# Patient Record
Sex: Female | Born: 2000 | Race: White | Hispanic: No | Marital: Single | Smoking: Never smoker
Health system: Southern US, Community
[De-identification: ages and names within clinical notes are randomized; demographics above are authoritative.]

## PROBLEM LIST (undated history)

## (undated) DIAGNOSIS — Z309 Encounter for contraceptive management, unspecified: Secondary | ICD-10-CM

## (undated) DIAGNOSIS — J40 Bronchitis, not specified as acute or chronic: Secondary | ICD-10-CM

## (undated) DIAGNOSIS — Z7689 Persons encountering health services in other specified circumstances: Principal | ICD-10-CM

## (undated) DIAGNOSIS — J302 Other seasonal allergic rhinitis: Secondary | ICD-10-CM

## (undated) DIAGNOSIS — Q523 Imperforate hymen: Secondary | ICD-10-CM

## (undated) DIAGNOSIS — J45909 Unspecified asthma, uncomplicated: Secondary | ICD-10-CM

## (undated) HISTORY — DX: Other seasonal allergic rhinitis: J30.2

## (undated) HISTORY — PX: MYRINGOTOMY WITH TUBE PLACEMENT: SHX5663

## (undated) HISTORY — DX: Bronchitis, not specified as acute or chronic: J40

## (undated) HISTORY — DX: Encounter for contraceptive management, unspecified: Z30.9

## (undated) HISTORY — DX: Persons encountering health services in other specified circumstances: Z76.89

## (undated) HISTORY — DX: Imperforate hymen: Q52.3

---
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
## 2018-02-20 ENCOUNTER — Encounter: Payer: Self-pay | Admitting: Emergency Medicine

## 2018-02-20 DIAGNOSIS — F329 Major depressive disorder, single episode, unspecified: Secondary | ICD-10-CM | POA: Insufficient documentation

## 2018-02-20 DIAGNOSIS — F411 Generalized anxiety disorder: Secondary | ICD-10-CM | POA: Insufficient documentation

## 2018-03-08 ENCOUNTER — Ambulatory Visit: Payer: Self-pay | Admitting: Physician Assistant

## 2019-01-04 ENCOUNTER — Other Ambulatory Visit: Payer: Self-pay | Admitting: Physician Assistant

## 2019-01-04 NOTE — Telephone Encounter (Signed)
Not seen in epic, pull paper chart please and see if current patient

## 2019-01-04 NOTE — Telephone Encounter (Signed)
I pulled her chart and it looks like her last appt. Was on 12/03/17. The orders do say 1 1/2 tablets daily at 100 mg. Would you like me to call her and remind her it's time for an appt. With [place]?

## 2019-01-05 NOTE — Telephone Encounter (Signed)
Yes, Jennifer Wise, please call her and have her set up appt w/ [place].  I'll go ahead and ok this Rx, b/c she's away in college, but let her know she must make appt for when she's home on break.  Thanks.

## 2019-01-05 NOTE — Telephone Encounter (Signed)
Patient's last appt was 11/03/2017, instructed to follow up in Nov/Dec when on break.   Wait to set up appt?

## 2019-01-05 NOTE — Telephone Encounter (Signed)
Called and spoke with her mother. She is going to make sure Bird schedules an appointment soon.

## 2019-01-05 NOTE — Telephone Encounter (Signed)
ok 

## 2019-02-14 ENCOUNTER — Other Ambulatory Visit: Payer: Self-pay

## 2019-02-14 ENCOUNTER — Encounter: Payer: Self-pay | Admitting: Physician Assistant

## 2019-02-14 ENCOUNTER — Ambulatory Visit (INDEPENDENT_AMBULATORY_CARE_PROVIDER_SITE_OTHER): Payer: BC Managed Care – PPO | Admitting: Physician Assistant

## 2019-02-14 DIAGNOSIS — G4700 Insomnia, unspecified: Secondary | ICD-10-CM

## 2019-02-14 DIAGNOSIS — F411 Generalized anxiety disorder: Secondary | ICD-10-CM

## 2019-02-14 MED ORDER — TRAZODONE HCL 50 MG PO TABS: 50.0000 mg | ORAL_TABLET | Freq: Every evening | ORAL | 0 refills | Status: DC | PRN

## 2019-02-14 MED ORDER — SERTRALINE HCL 100 MG PO TABS: 200.0000 mg | ORAL_TABLET | Freq: Every day | ORAL | 0 refills | Status: DC

## 2019-02-14 NOTE — Progress Notes (Signed)
[facility]  Patient ID: Jennifer Wise,  MRN: [id number]  PCP: Assunta Found, MD  Date of Evaluation: 02/14/2019 Time spent:15 minutes  Chief Complaint:  Chief Complaint    Follow-up     Virtual Visit via Telephone Note  I connected with patient by a video enabled telemedicine application or telephone, with their informed consent, and verified patient privacy and that I am speaking with the correct person using two identifiers.  I am private, in my office and the patient is home in [place]. (college)  I discussed the limitations, risks, security and privacy concerns of performing an evaluation and management service by telephone and the availability of in person appointments. I also discussed with the patient that there may be a patient responsible charge related to this service. The patient expressed understanding and agreed to proceed.   I discussed the assessment and treatment plan with the patient. The patient was provided an opportunity to ask questions and all were answered. The patient agreed with the plan and demonstrated an understanding of the instructions.   The patient was advised to call back or seek an in-person evaluation if the symptoms worsen or if the condition fails to improve as anticipated.  I provided 15 minutes of non-face-to-face time during this encounter.  HISTORY/CURRENT STATUS: HPI for routine med check.  Patient states that for the past 3 months or so she has just felt overwhelmed.  She feels anxious most days.  Sometimes things can be so overwhelming that she just wants to go to bed.  Denies depression symptoms.  She is able to enjoy things.  Energy and motivation are good.  She is not isolating.  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  She is also involved in a sorority, which takes up quite a bit of time as well.  "It is just a lot.  It might have something to do with COVID to but I just feel like  there is a lot on me."  Denies suicidal or homicidal thoughts.  Sleeps well with the trazodone.  Patient denies increased energy with decreased need for sleep, no increased talkativeness, no racing thoughts, no impulsivity or risky behaviors, no increased spending, no increased libido, no grandiosity.  Denies dizziness, syncope, seizures, numbness, tingling, tremor, tics, unsteady gait, slurred speech, confusion. Denies muscle or joint pain, stiffness, or dystonia.  Individual Medical History/ Review of Systems: Changes? :No    Past medications for mental health diagnoses include: unknown  Allergies: Patient has no known allergies.  Current Medications:  Current Outpatient Medications:  .  etonogestrel-ethinyl estradiol (NUVARING) 0.12-0.015 MG/24HR vaginal ring, Insert vaginally and leave in place for 3 consecutive weeks, then remove for 1 week., Disp: 1 each, Rfl: 12 .  Fexofenadine HCl (ALLEGRA PO), Take by mouth daily. , Disp: , Rfl:  .  traZODone (DESYREL) 50 MG tablet, Take 1-2 tablets (50-100 mg total) by mouth at bedtime as needed., Disp: 180 tablet, Rfl: 0 .  BLISOVI FE 1.5/30 1.5-30 MG-MCG tablet, TAKE 1 TABLET BY MOUTH DAILY (Patient not taking: Reported on 10/13/2017), Disp: 84 tablet, Rfl: 3 .  naproxen sodium (ALEVE) 220 MG tablet, Take 1 tablet (220 mg total) by mouth 2 (two) times daily with a meal. (Patient not taking: Reported on 10/13/2017), Disp: , Rfl:  .  sertraline (ZOLOFT) 100 MG tablet, Take 2 tablets (200 mg total) by mouth daily., Disp: 180 tablet, Rfl: 0 Medication Side Effects: none  Family Medical/ Social  History: Changes? Yes see above  MENTAL HEALTH EXAM:  There were no vitals taken for this visit.There is no height or weight on file to calculate BMI.  General Appearance: unable to assess  Eye Contact:  unable to assess  Speech:  Clear and Coherent  Volume:  Normal  Mood:  Euthymic  Affect:  unable to assess  Thought Process:  Goal Directed and  Descriptions of Associations: Intact  Orientation:  Full (Time, Place, and Person)  Thought Content: Logical   Suicidal Thoughts:  No  Homicidal Thoughts:  No  Memory:  WNL  Judgement:  Good  Insight:  Good  Psychomotor Activity:  unable to assess  Concentration:  Concentration: Good  Recall:  Good  Fund of Knowledge: Good  Language: Good  Assets:  Desire for Improvement  ADL's:  Intact  Cognition: WNL  Prognosis:  Good    DIAGNOSES:    ICD-10-CM   1. Generalized anxiety disorder  F41.1   2. Insomnia, unspecified type  G47.00     Receiving Psychotherapy: No    RECOMMENDATIONS:  Increase Zoloft to 200 mg p.o. daily. Continue trazodone 50 mg, 1-2 nightly as needed. Discussed coping techniques. Return in 6 weeks.  Donnal Moat, PA-C

## 2019-03-28 ENCOUNTER — Ambulatory Visit: Payer: BC Managed Care – PPO | Admitting: Physician Assistant

## 2019-04-12 ENCOUNTER — Other Ambulatory Visit: Payer: Self-pay | Admitting: Physician Assistant

## 2019-04-28 ENCOUNTER — Ambulatory Visit (INDEPENDENT_AMBULATORY_CARE_PROVIDER_SITE_OTHER): Payer: BC Managed Care – PPO | Admitting: Physician Assistant

## 2019-04-28 ENCOUNTER — Encounter: Payer: Self-pay | Admitting: Physician Assistant

## 2019-04-28 DIAGNOSIS — F411 Generalized anxiety disorder: Secondary | ICD-10-CM

## 2019-04-28 DIAGNOSIS — G4700 Insomnia, unspecified: Secondary | ICD-10-CM

## 2019-04-28 DIAGNOSIS — F329 Major depressive disorder, single episode, unspecified: Secondary | ICD-10-CM

## 2019-04-28 MED ORDER — BUSPIRONE HCL 15 MG PO TABS: ORAL_TABLET | ORAL | 1 refills | Status: DC

## 2019-04-28 MED ORDER — TRAZODONE HCL 50 MG PO TABS: 50.0000 mg | ORAL_TABLET | Freq: Every evening | ORAL | 0 refills | Status: DC | PRN

## 2019-04-28 MED ORDER — SERTRALINE HCL 100 MG PO TABS: 200.0000 mg | ORAL_TABLET | Freq: Every day | ORAL | 0 refills | Status: DC

## 2019-04-28 NOTE — Progress Notes (Signed)
[facility]  Patient ID: Jennifer Wise,  MRN: [id number]  PCP: Assunta Found, MD  Date of Evaluation: 04/28/2019 Time spent:20 minutes  Chief Complaint:  Chief Complaint    Depression; Insomnia     Virtual Visit via Telephone Note  I connected with patient by a video enabled telemedicine application or telephone, with their informed consent, and verified patient privacy and that I am speaking with the correct person using two identifiers.  I am private, in my home and the patient is home (college in [place].)  I discussed the limitations, risks, security and privacy concerns of performing an evaluation and management service by telephone and the availability of in person appointments. I also discussed with the patient that there may be a patient responsible charge related to this service. The patient expressed understanding and agreed to proceed.   I discussed the assessment and treatment plan with the patient. The patient was provided an opportunity to ask questions and all were answered. The patient agreed with the plan and demonstrated an understanding of the instructions.   The patient was advised to call back or seek an in-person evaluation if the symptoms worsen or if the condition fails to improve as anticipated.  I provided 20 minutes of non-face-to-face time during this encounter.  HISTORY/CURRENT STATUS: HPI For routine med check.   We increased the Zoloft d/t anxiety. Not any better.  Still gets random 'burst of anxiety.  Not a full-blown PA but it'll last about 30 mins to an hour.'  She tries to distract herself with music or getting online.  That helps for only a short while and then she starts getting anxious again.  It is more of a generalized sense of anxiety like something bad is about to happen.  She denies having palpitations or sweats, no shortness of breath.  Is tired all the time, and doesn't have a lot of energy or motivation.  "I want to sleep all  the time. I sleep maybe 10-12 hours and still feel tired." When she gets a chance, she does enjoy things like going to the beach w/ friends, but she has to make herself go.  States she feels nervous about going out due to the anxiety and she feels safer in her apartment.  Doesn't cry easily.  Denies SI/HI.  Every few weeks, she'll feel more energetic and motivated.  But she does not have decreased need for sleep.  She still sleeps 10 to 12 hours a night.  No associated impulsivity or risky behaviors, no increased spending or libido.  No grandiosity.  No hallucinations.  No irritability.  She still needs the trazodone to fall asleep.  She takes 1 or 2 depending on how she is feeling.  She does not feel this is what is causing the increased fatigue or somnolence.  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  Denies dizziness, syncope, seizures, numbness, tingling, tremor, tics, unsteady gait, slurred speech, confusion. Denies muscle or joint pain, stiffness, or dystonia.  Individual Medical History/ Review of Systems: Changes? :No    Past medications for mental health diagnoses include: Zoloft, Trazodone  Allergies: Patient has no known allergies.  Current Medications:  Current Outpatient Medications:  .  etonogestrel-ethinyl estradiol (NUVARING) 0.12-0.015 MG/24HR vaginal ring, Insert vaginally and leave in place for 3 consecutive weeks, then remove for 1 week., Disp: 1 each, Rfl: 12 .  Fexofenadine HCl (ALLEGRA PO), Take by mouth daily. , Disp: , Rfl:  .  sertraline (ZOLOFT) 100 MG tablet, Take 2 tablets (200 mg total) by mouth daily., Disp: 180 tablet, Rfl: 0 .  traZODone (DESYREL) 50 MG tablet, Take 1-2 tablets (50-100 mg total) by mouth at bedtime as needed., Disp: 180 tablet, Rfl: 0 .  BLISOVI FE 1.5/30 1.5-30 MG-MCG tablet, TAKE 1 TABLET BY MOUTH DAILY (Patient not taking: Reported on 10/13/2017), Disp: 84 tablet, Rfl: 3 .  busPIRone (BUSPAR) 15 MG tablet, 1/3  po bid for 1 week, then 2/3 po bid for 1 week, then 1 pill bid., Disp: 60 tablet, Rfl: 1 .  naproxen sodium (ALEVE) 220 MG tablet, Take 1 tablet (220 mg total) by mouth 2 (two) times daily with a meal. (Patient not taking: Reported on 10/13/2017), Disp: , Rfl:  Medication Side Effects: none  Family Medical/ Social History: Changes? No  MENTAL HEALTH EXAM:  There were no vitals taken for this visit.There is no height or weight on file to calculate BMI.  General Appearance: unable to assess  Eye Contact:  unable to assess  Speech:  Clear and Coherent  Volume:  Normal  Mood:  Euthymic  Affect:  unable to assess  Thought Process:  Goal Directed and Descriptions of Associations: Intact  Orientation:  Full (Time, Place, and Person)  Thought Content: Logical   Suicidal Thoughts:  No  Homicidal Thoughts:  No  Memory:  WNL  Judgement:  Good  Insight:  Good  Psychomotor Activity:  unable to assess  Concentration:  Concentration: Good  Recall:  Good  Fund of Knowledge: Good  Language: Good  Assets:  Desire for Improvement  ADL's:  Intact  Cognition: WNL  Prognosis:  Good    DIAGNOSES:    ICD-10-CM   1. Generalized anxiety disorder  F41.1   2. Insomnia, unspecified type  G47.00   3. 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  We discussed her symptoms and I believe at least in part that she is avoiding going out and doing things because of the anxiety more so than depression.  She agrees. Options would be to change the Zoloft to a different SSRI or even try an SNRI.  Or add BuSpar.  After discussing the benefits, risks, side effects, she prefers to add the BuSpar.   I did not discuss this with her today but if she is not feeling any better at the next visit, I think we should draw CBC, CMP and TSH. Start BuSpar 15 mg, one third p.o. twice daily for 1 week, then two thirds p.o. twice daily for 1 week, then 1 p.o. twice  daily. Continue Zoloft 100 mg, 2 p.o. daily. Continue trazodone 50 mg, 1-2 nightly as needed. Recommend counseling.  She can see someone at [facility] or get an appointment for one of our therapist in the office.  They can also do telehealth. Return in 6 weeks.  Donnal Moat, PA-C

## 2019-05-19 ENCOUNTER — Ambulatory Visit: Payer: BC Managed Care – PPO | Admitting: Physician Assistant

## 2019-07-13 ENCOUNTER — Other Ambulatory Visit: Payer: Self-pay | Admitting: Physician Assistant

## 2019-11-26 ENCOUNTER — Other Ambulatory Visit: Payer: Self-pay | Admitting: Physician Assistant

## 2020-03-23 ENCOUNTER — Other Ambulatory Visit: Payer: Self-pay | Admitting: Physician Assistant

## 2020-06-17 ENCOUNTER — Telehealth (INDEPENDENT_AMBULATORY_CARE_PROVIDER_SITE_OTHER): Payer: BC Managed Care – PPO | Admitting: Physician Assistant

## 2020-06-17 ENCOUNTER — Encounter: Payer: Self-pay | Admitting: Physician Assistant

## 2020-06-17 DIAGNOSIS — F411 Generalized anxiety disorder: Secondary | ICD-10-CM

## 2020-06-17 DIAGNOSIS — G4700 Insomnia, unspecified: Secondary | ICD-10-CM

## 2020-06-17 MED ORDER — BUSPIRONE HCL 15 MG PO TABS: ORAL_TABLET | ORAL | 1 refills | Status: DC

## 2020-06-17 MED ORDER — TRAZODONE HCL 50 MG PO TABS: 50.0000 mg | ORAL_TABLET | Freq: Every evening | ORAL | 1 refills | Status: DC | PRN

## 2020-06-17 NOTE — Progress Notes (Signed)
[facility]  Patient ID: Jennifer Wise,  MRN: [id number]  PCP: Assunta Found, MD  Date of Evaluation: 06/17/2020 Time spent:30 minutes  Chief Complaint:  Chief Complaint    Anxiety; Depression; Insomnia     Virtual Visit via Telehealth  I connected with patient by a video enabled telemedicine application with their informed consent, and verified patient privacy and that I am speaking with the correct person using two identifiers.  I am private, in my office and the patient is at home.  I discussed the limitations, risks, security and privacy concerns of performing an evaluation and management service by video and the availability of in person appointments. I also discussed with the patient that there may be a patient responsible charge related to this service. The patient expressed understanding and agreed to proceed.   I discussed the assessment and treatment plan with the patient. The patient was provided an opportunity to ask questions and all were answered. The patient agreed with the plan and demonstrated an understanding of the instructions.   The patient was advised to call back or seek an in-person evaluation if the symptoms worsen or if the condition fails to improve as anticipated.  I provided 30 minutes of non-face-to-face time during this encounter.   HISTORY/CURRENT STATUS: HPI to discuss anxiety.  Lost to follow-up for over a year.  Now reports a lot of anxiety, ruminating thoughts mostly.  Will have a thought stuck in her head and cannot get out of that loop for  hours on the end.  Has a hard time falling asleep because of repetitive thoughts.  No compulsions.  She was given BuSpar at the last visit over a year ago but not sure if it helped or not.  She did not take it very long.  No palpitations, shortness of breath, chest pain, or increased sweating.  Graduated from [employer] in Dec.  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  Says her mom wanted patient to ask me about ADHD.  Her mom has it.  Patient was never diagnosed at a younger age but states the last semester of college she had a hard time staying on task and getting things done in a timely manner.  Things took her much longer to get done and then they used to.  Patient denies loss of interest in usual activities and is able to enjoy things.  Motivation is decreased slightly, just feels too anxious to want to do things..  Appetite has not changed.  No extreme sadness, tearfulness, or feelings of hopelessness.  Denies suicidal or homicidal thoughts.  Patient denies increased energy with decreased need for sleep, no increased talkativeness, no racing thoughts, no impulsivity or risky behaviors, no increased spending, no increased libido, no grandiosity, no increased irritability or anger, and no hallucinations.  Denies dizziness, syncope, seizures, numbness, tingling, tremor, tics, unsteady gait, slurred speech, confusion. Denies muscle or joint pain, stiffness, or dystonia.  Individual Medical History/ Review of Systems: Changes? :No    Past medications for mental health diagnoses include: Zoloft, Trazodone, Buspar  Allergies: Patient has no known allergies.  Current Medications:  Current Outpatient Medications:  .  busPIRone (BUSPAR) 15 MG tablet, 1/3 po bid for 1 week, then 2/3 po bid for 1 week, then 1 po bid, Disp: 60 tablet, Rfl: 1 .  BLISOVI FE 1.5/30 1.5-30  MG-MCG tablet, TAKE 1 TABLET BY MOUTH DAILY (Patient not taking: Reported on 10/13/2017), Disp: 84 tablet, Rfl: 3 .  etonogestrel-ethinyl estradiol (NUVARING) 0.12-0.015 MG/24HR vaginal ring, Insert vaginally and leave in place for 3 consecutive weeks, then remove for 1 week. (Patient not taking: Reported on 06/17/2020), Disp: 1 each, Rfl: 12 .  Fexofenadine HCl (ALLEGRA PO), Take by mouth daily.  (Patient not  taking: Reported on 06/17/2020), Disp: , Rfl:  .  naproxen sodium (ALEVE) 220 MG tablet, Take 1 tablet (220 mg total) by mouth 2 (two) times daily with a meal. (Patient not taking: No sig reported), Disp: , Rfl:  .  sertraline (ZOLOFT) 100 MG tablet, Take 2 tablets (200 mg total) by mouth daily. (Patient not taking: Reported on 06/17/2020), Disp: 180 tablet, Rfl: 0 .  traZODone (DESYREL) 50 MG tablet, Take 1-2 tablets (50-100 mg total) by mouth at bedtime as needed., Disp: 180 tablet, Rfl: 1 Medication Side Effects: none  Family Medical/ Social History: Changes? No  MENTAL HEALTH EXAM:  There were no vitals taken for this visit.There is no height or weight on file to calculate BMI.  General Appearance: Casual, Neat and Well Groomed  Eye Contact:  Good  Speech:  Clear and Coherent  Volume:  Normal  Mood:  Euthymic  Affect:  Appropriate  Thought Process:  Goal Directed and Descriptions of Associations: Intact  Orientation:  Full (Time, Place, and Person)  Thought Content: Logical   Suicidal Thoughts:  No  Homicidal Thoughts:  No  Memory:  WNL  Judgement:  Good  Insight:  Good  Psychomotor Activity:  Normal  Concentration:  Concentration: Good and Attention Span: Fair  Recall:  Good  Fund of Knowledge: Good  Language: Good  Assets:  Desire for Improvement  ADL's:  Intact  Cognition: WNL  Prognosis:  Good    DIAGNOSES:    ICD-10-CM   1. Generalized anxiety disorder  F41.1   2. Insomnia, unspecified type  G47.00     Receiving Psychotherapy: No    RECOMMENDATIONS:  PDMP was reviewed. I provided 30 minutes of nonface-to-face time during this encounter, including time spent before and after the appointment and chart review. Briefly discussed ADHD symptoms.  I think the anxiety she is experiencing is the most likely cause for her decreased concentration.  We will see how those symptoms are after treating the anxiety. Discussed treatment for anxiety, including restarting the Zoloft  and/or BuSpar.  We agree that BuSpar would be the better choice since she is not having symptoms of depression except low energy and motivation, it seems that she does not want to do things because she is too anxious to do so.  Sleep hygiene was discussed.  No caffeine after lunch.  Blue blocker glasses. Start BuSpar 15 mg 1/3 tablet twice daily for 1 week, then increase to 2/3 tablet twice daily for 1 week, then increase to 1 tablet twice daily for anxiety. Continue trazodone 50 mg, 1-2 p.o. nightly as needed sleep. Consider counseling. Return in 2 months.  Melony Overly, PA-C

## 2021-04-21 ENCOUNTER — Other Ambulatory Visit: Payer: Self-pay

## 2021-04-21 ENCOUNTER — Encounter: Payer: Self-pay | Admitting: Physician Assistant

## 2021-04-21 ENCOUNTER — Ambulatory Visit (INDEPENDENT_AMBULATORY_CARE_PROVIDER_SITE_OTHER): Payer: BC Managed Care – PPO | Admitting: Physician Assistant

## 2021-04-21 DIAGNOSIS — F32A Depression, unspecified: Secondary | ICD-10-CM | POA: Diagnosis not present

## 2021-04-21 DIAGNOSIS — G4700 Insomnia, unspecified: Secondary | ICD-10-CM | POA: Diagnosis not present

## 2021-04-21 DIAGNOSIS — F411 Generalized anxiety disorder: Secondary | ICD-10-CM | POA: Diagnosis not present

## 2021-04-21 MED ORDER — ESCITALOPRAM OXALATE 5 MG PO TABS: 5.0000 mg | ORAL_TABLET | Freq: Every day | ORAL | 1 refills | Status: DC

## 2021-04-21 MED ORDER — TRAZODONE HCL 50 MG PO TABS: 50.0000 mg | ORAL_TABLET | Freq: Every evening | ORAL | 1 refills | Status: DC | PRN

## 2021-04-21 NOTE — Progress Notes (Signed)
[facility]  Patient ID: Jennifer Wise,  MRN: [id number]  PCP: Assunta Found, MD  Date of Evaluation: 04/21/2021 Time spent:30 minutes  Chief Complaint:  Chief Complaint   Anxiety; Follow-up      HISTORY/CURRENT STATUS: Lost to follow-up for 6 to 8 months.  More anxious, PA last around 7-8 minutes but then she cries afterwards. Panic attacks happen a few times a month. Has racing thoughts daily. Can't get things out of her head. Ruminates and obsesses about things. Compulsively checks her alarm or she can't relax.  Trouble falling asleep and staying asleep just because she cannot get her mind to shut off.  Patient denies loss of interest in usual activities and is able to enjoy things. Energy and motivation are good.   No extreme sadness or increased crying.  She does feel kind of hopeless at times.  Appetite is normal.  No calorie restricting, binging or purging, or laxative use.  No cutting.  Denies suicidal or homicidal thoughts.  Patient denies increased energy with decreased need for sleep, no increased talkativeness, no racing thoughts, no impulsivity or risky behaviors, no increased spending, no increased libido, no grandiosity, no increased irritability or anger, and no hallucinations.  Review of Systems  Constitutional: Negative.   HENT: Negative.    Eyes: Negative.   Respiratory: Negative.    Cardiovascular: Negative.   Gastrointestinal: Negative.   Genitourinary: Negative.   Musculoskeletal: Negative.   Skin: Negative.   Neurological: Negative.   Endo/Heme/Allergies: Negative.   Psychiatric/Behavioral:         See HPI.   Individual Medical History/ Review of Systems: Changes? :No    Past medications for mental health diagnoses include: Zoloft, Trazodone, Buspar probably wasn't effective (she only took for 2 months though)  Allergies: Patient has no known allergies.  Current Medications:  Current Outpatient Medications:    escitalopram (LEXAPRO) 5  MG tablet, Take 1 tablet (5 mg total) by mouth daily., Disp: 30 tablet, Rfl: 1   BLISOVI FE 1.5/30 1.5-30 MG-MCG tablet, TAKE 1 TABLET BY MOUTH DAILY (Patient not taking: No sig reported), Disp: 84 tablet, Rfl: 3   busPIRone (BUSPAR) 15 MG tablet, 1/3 po bid for 1 week, then 2/3 po bid for 1 week, then 1 po bid (Patient not taking: Reported on 04/21/2021), Disp: 60 tablet, Rfl: 1   etonogestrel-ethinyl estradiol (NUVARING) 0.12-0.015 MG/24HR vaginal ring, Insert vaginally and leave in place for 3 consecutive weeks, then remove for 1 week. (Patient not taking: Reported on 06/17/2020), Disp: 1 each, Rfl: 12   Fexofenadine HCl (ALLEGRA PO), Take by mouth daily.  (Patient not taking: Reported on 06/17/2020), Disp: , Rfl:    naproxen sodium (ALEVE) 220 MG tablet, Take 1 tablet (220 mg total) by mouth 2 (two) times daily with a meal. (Patient not taking: Reported on 10/13/2017), Disp: , Rfl:    sertraline (ZOLOFT) 100 MG tablet, Take 2 tablets (200 mg total) by mouth daily. (Patient not taking: Reported on 06/17/2020), Disp: 180 tablet, Rfl: 0   traZODone (DESYREL) 50 MG tablet, Take 1-2 tablets (50-100 mg total) by mouth at bedtime as needed., Disp: 180 tablet, Rfl: 1 Medication Side Effects: none  Family Medical/ Social History: Changes? Graduated from [employer] last month. 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  Moved back home with her dad.  Is happy there.  MENTAL HEALTH EXAM:  There were no vitals taken for this visit.There is no height or weight on file to calculate BMI.  General  Appearance: Casual, Neat and Well Groomed  Eye Contact:  Good  Speech:  Clear and Coherent  Volume:  Normal  Mood:  Anxious and Depressed  Affect:  Depressed, Tearful, and Anxious  Thought Process:  Goal Directed and Descriptions of Associations: Intact  Orientation:  Full (Time, Place, and Person)  Thought Content: Logical   Suicidal Thoughts:  No  Homicidal Thoughts:  No  Memory:  WNL  Judgement:  Good   Insight:  Good  Psychomotor Activity:  Normal  Concentration:  Concentration: Good and Attention Span: Fair  Recall:  Good  Fund of Knowledge: Good  Language: Good  Assets:  Desire for Improvement  ADL's:  Intact  Cognition: WNL  Prognosis:  Good    DIAGNOSES:    ICD-10-CM   1. Generalized anxiety disorder  F41.1     2. Mild depression  F32.A     3. Insomnia, unspecified type  G47.00        Receiving Psychotherapy: No    RECOMMENDATIONS:  PDMP was reviewed. No results available.  I provided 30 minutes of face to face time during this encounter, including time spent before and after the visit in records review, medical decision making, counseling pertinent to today's visit, and charting.  We discussed her symptoms and I recommend an SSRI.  She has taken Zoloft in the past but does not remember if it was effective or not or why it was stopped.  It is not clear in the notes as she has been lost to follow-up on several different occasions. We discussed the purpose of the SSRI, not only to help depression but it is a great medication to prevent anxiety as well.  Benefits, risks and side effects were discussed and she would like to try a different 1.  I recommend Lexapro and ironically, her mom takes it and does well. Start Lexapro 5 mg, 1 p.o. daily. Continue trazodone 50 mg, 1-2 p.o. nightly as needed sleep. Recommend counseling. Return in 4 to 6 weeks.  Melony Overly, PA-C

## 2021-05-15 ENCOUNTER — Other Ambulatory Visit (HOSPITAL_COMMUNITY)
Admission: RE | Admit: 2021-05-15 | Discharge: 2021-05-15 | Disposition: A | Discharge status: Home / Self Care | Payer: BC Managed Care – PPO | Source: Ambulatory Visit | Attending: Adult Health | Admitting: Adult Health

## 2021-05-15 ENCOUNTER — Encounter: Payer: Self-pay | Admitting: Adult Health

## 2021-05-15 ENCOUNTER — Other Ambulatory Visit: Payer: Self-pay

## 2021-05-15 ENCOUNTER — Ambulatory Visit (INDEPENDENT_AMBULATORY_CARE_PROVIDER_SITE_OTHER): Payer: BC Managed Care – PPO | Admitting: Adult Health

## 2021-05-15 VITALS — BP 112/72 | HR 79 | Ht 68.0 in | Wt 155.0 lb

## 2021-05-15 DIAGNOSIS — Z01419 Encounter for gynecological examination (general) (routine) without abnormal findings: Secondary | ICD-10-CM | POA: Insufficient documentation

## 2021-05-15 NOTE — Progress Notes (Signed)
Patient ID: Jennifer Wise, female   DOB: Jun 30, 2000, 21 y.o.   MRN: [id number] History of Present Illness: Jennifer Wise is a 21 year old white female,single, G0P0, in for a well woman gyn exam and first pap. She is going to get her Master's in August. PCP is Dr Phillips Odor.   Current Medications, Allergies, Past Medical History, Past Surgical History, Family History and Social History were reviewed in [employer] record.     Review of Systems:  Patient denies any headaches, hearing loss, fatigue, blurred vision, shortness of breath, chest pain, abdominal pain, problems with bowel movements, urination, or intercourse. (Not active). No joint pain or mood swings.  She is off depo and this is first period and it is a little heavier that what she remembered.   Physical Exam:BP 112/72 (BP Location: Left Arm, Patient Position: Sitting, Cuff Size: Normal)    Pulse 79    Ht 5\' 8"  (1.727 m)    Wt 155 lb (70.3 kg)    LMP 05/10/2021    BMI 23.57 kg/m   General:  Well developed, well nourished, no acute distress Skin:  Warm and dry Neck:  Midline trachea, normal thyroid, good ROM, no lymphadenopathy Lungs; Clear to auscultation bilaterally Breast:  No dominant palpable mass, retraction, or nipple discharge Cardiovascular: Regular rate and rhythm Abdomen:  Soft, non tender, no hepatosplenomegaly has light brown birth mark LLQ on abdomen. Pelvic:  External genitalia is normal in appearance, no lesions.  The vagina is normal in appearance,+period blood in vault.05/12/2021 Urethra has no lesions or masses. The cervix is nulliparous, pap with GC/CHL performed. Uterus is felt to be normal size, shape, and contour.  No adnexal masses or tenderness noted.Bladder is non tender, no masses felt. Rectal:Deferred Extremities/musculoskeletal:  No swelling or varicosities noted, no clubbing or cyanosis Psych:  No mood changes, alert and cooperative,seems happy AA is 6 Fall risk is low Depression screen PHQ 2/9  05/15/2021  Decreased Interest 1  Down, Depressed, Hopeless 1  PHQ - 2 Score 2  Altered sleeping 2  Tired, decreased energy 1  Change in appetite 0  Feeling bad or failure about yourself  1  Trouble concentrating 2  Moving slowly or fidgety/restless 0  Suicidal thoughts 0  PHQ-9 Score 8    She is on lexapro and trazodone and sees 07/13/2021.   GAD 7 : Generalized Anxiety Score 05/15/2021  Nervous, Anxious, on Edge 1  Control/stop worrying 1  Worry too much - different things 1  Trouble relaxing 1  Restless 2  Easily annoyed or irritable 2  Afraid - awful might happen 1  Total GAD 7 Score 9      Upstream - 05/15/21 1050       Pregnancy Intention Screening   Does the patient want to become pregnant in the next year? No    Does the patient's partner want to become pregnant in the next year? No    Would the patient like to discuss contraceptive options today? No      Contraception Wrap Up   Current Method Abstinence    End Method Female Condom            Examination chaperoned by 07/13/21 LPN  Impression and Plan: 1. Encounter for gynecological examination with Papanicolaou smear of cervix Pap sent  Physical in 1 year Pap in 3 if normal Call if wants OCs to control periods if not better after few cycles

## 2021-05-20 LAB — CYTOLOGY - PAP
Chlamydia: NEGATIVE
Comment: NEGATIVE
Comment: NORMAL
Diagnosis: NEGATIVE
Neisseria Gonorrhea: NEGATIVE

## 2021-05-25 ENCOUNTER — Other Ambulatory Visit: Payer: Self-pay | Admitting: Physician Assistant

## 2021-05-28 ENCOUNTER — Other Ambulatory Visit: Payer: Self-pay

## 2021-05-28 ENCOUNTER — Encounter: Payer: Self-pay | Admitting: Physician Assistant

## 2021-05-28 ENCOUNTER — Ambulatory Visit (INDEPENDENT_AMBULATORY_CARE_PROVIDER_SITE_OTHER): Payer: BC Managed Care – PPO | Admitting: Physician Assistant

## 2021-05-28 DIAGNOSIS — F32A Depression, unspecified: Secondary | ICD-10-CM

## 2021-05-28 DIAGNOSIS — F411 Generalized anxiety disorder: Secondary | ICD-10-CM

## 2021-05-28 DIAGNOSIS — G4700 Insomnia, unspecified: Secondary | ICD-10-CM | POA: Diagnosis not present

## 2021-05-28 MED ORDER — ESCITALOPRAM OXALATE 10 MG PO TABS: 10.0000 mg | ORAL_TABLET | Freq: Every day | ORAL | 1 refills | Status: DC

## 2021-05-28 NOTE — Progress Notes (Signed)
[facility]  Patient ID: Jennifer Wise,  MRN: [id number]  PCP: Assunta Found, MD  Date of Evaluation: 05/28/2021 Time spent:20 minutes  Chief Complaint:  Chief Complaint   Anxiety; Depression; Insomnia; Follow-up     HISTORY/CURRENT STATUS: Here for 6-week follow-up.  Lexapro was started at the last visit.  States she is about 60% better than before starting it.  She does have anxiety still, more generalized than panic attacks.  No known trigger.  She is having some stomach upset a couple of times a week since starting the Lexapro.  Mostly loose stools.  She does not feel like it is bad enough to change medications though since this is helping.  Is able to enjoy things.  Denies decreased energy or motivation.  Appetite has not changed.  No extreme sadness, tearfulness, or feelings of hopelessness.  Denies any changes in concentration, making decisions or remembering things.  Sleeping well using the trazodone 50 mg nightly.  Denies suicidal or homicidal thoughts.  Denies dizziness, syncope, seizures, numbness, tingling, tremor, tics, unsteady gait, slurred speech, confusion. Denies muscle or joint pain, stiffness, or dystonia.  Individual Medical History/ Review of Systems: Changes? :No    Past medications for mental health diagnoses include: Zoloft, Trazodone, Buspar probably wasn't effective (she only took for 2 months though)  Allergies: Patient has no known allergies.  Current Medications:  Current Outpatient Medications:    escitalopram (LEXAPRO) 10 MG tablet, Take 1 tablet (10 mg total) by mouth daily., Disp: 90 tablet, Rfl: 1   traZODone (DESYREL) 50 MG tablet, Take 1-2 tablets (50-100 mg total) by mouth at bedtime as needed., Disp: 180 tablet, Rfl: 1 Medication Side Effects: none  Family Medical/ Social History: Changes? no  MENTAL HEALTH EXAM:  Last menstrual period 05/10/2021.There is no height or weight on file to calculate BMI.  General Appearance:  Casual, Neat and Well Groomed  Eye Contact:  Good  Speech:  Clear and Coherent  Volume:  Normal  Mood:  Anxious  Affect:  Anxious  Thought Process:  Goal Directed and Descriptions of Associations: Intact  Orientation:  Full (Time, Place, and Person)  Thought Content: Logical   Suicidal Thoughts:  No  Homicidal Thoughts:  No  Memory:  WNL  Judgement:  Good  Insight:  Good  Psychomotor Activity:  Normal  Concentration:  Concentration: Good and Attention Span: Fair  Recall:  Good  Fund of Knowledge: Good  Language: Good  Assets:  Desire for Improvement  ADL's:  Intact  Cognition: WNL  Prognosis:  Good    DIAGNOSES:    ICD-10-CM   1. Generalized anxiety disorder  F41.1     2. Mild depression  F32.A     3. Insomnia, unspecified type  G47.00         Receiving Psychotherapy: No    RECOMMENDATIONS:  PDMP was reviewed. No results available.  I provided 20 minutes of face to face time during this encounter, including time spent before and after the visit in records review, medical decision making, counseling pertinent to today's visit, and charting.  I am glad to hear that the Lexapro is helping some.  I do recommend increasing the dose.  She will let me know if the GI issues worsen or become intolerable and I will change to a different SSRI.  She can call between visits if that is the case. Increase Lexapro to 10 mg, 1 p.o. daily. Continue trazodone 50 mg, 1-2 p.o. nightly as needed sleep. Return in 6  weeks.  Melony Overly, PA-C

## 2021-06-05 ENCOUNTER — Other Ambulatory Visit: Payer: Self-pay

## 2021-06-05 ENCOUNTER — Ambulatory Visit (INDEPENDENT_AMBULATORY_CARE_PROVIDER_SITE_OTHER): Payer: BC Managed Care – PPO

## 2021-06-05 ENCOUNTER — Ambulatory Visit
Admission: EM | Admit: 2021-06-05 | Discharge: 2021-06-05 | Disposition: A | Discharge status: Home / Self Care | Payer: BC Managed Care – PPO | Attending: Urgent Care | Admitting: Urgent Care

## 2021-06-05 ENCOUNTER — Encounter: Payer: Self-pay | Admitting: Emergency Medicine

## 2021-06-05 DIAGNOSIS — M79675 Pain in left toe(s): Secondary | ICD-10-CM | POA: Diagnosis not present

## 2021-06-05 DIAGNOSIS — S90122A Contusion of left lesser toe(s) without damage to nail, initial encounter: Secondary | ICD-10-CM | POA: Diagnosis not present

## 2021-06-05 DIAGNOSIS — S91312A Laceration without foreign body, left foot, initial encounter: Secondary | ICD-10-CM | POA: Diagnosis not present

## 2021-06-05 DIAGNOSIS — W2209XA Striking against other stationary object, initial encounter: Secondary | ICD-10-CM | POA: Diagnosis not present

## 2021-06-05 MED ORDER — NAPROXEN 375 MG PO TABS: 375.0000 mg | ORAL_TABLET | Freq: Two times a day (BID) | ORAL | 0 refills | Status: DC

## 2021-06-05 NOTE — ED Provider Notes (Signed)
[facility]   MRN: [id number] DOB: [birth year]  Subjective:   Jennifer Wise is a 21 y.o. female presenting for suffering a left fourth toe injury.  Patient woke up to go to the bathroom in the middle the night.  When she came back she stubbed her toe against the bed.  This caused some bleeding from the left fourth toe.  She has applied Neosporin to the area.  Tdap is up-to-date.  No current facility-administered medications for this encounter.  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   Menstrual extraction 01/02/2013   Seasonal allergies      Past Surgical History:  Procedure Laterality Date   HYMENECTOMY N/A 03/30/2014   Procedure: HYMENOTOMY;  Surgeon: Tilda Burrow, MD;  Location: [facility];  Service: Gynecology;  Laterality: N/A;   MYRINGOTOMY WITH TUBE PLACEMENT Bilateral     Family History  Problem Relation Age of Onset   Diabetes Paternal Grandfather    Heart attack Paternal Grandfather    Macular degeneration Maternal Grandfather    Cancer Maternal Grandfather        colon   Breast cancer Mother     Social History   Tobacco Use   Smoking status: Never   Smokeless tobacco: Never  Vaping Use   Vaping Use: Never used  Substance Use Topics   Alcohol use: Yes    Alcohol/week: 8.0 standard drinks    Types: 8 Standard drinks or equivalent per week    Comment: sometimes   Drug use: No    ROS   Objective:   Vitals: BP 131/89 (BP Location: Right Arm)    Pulse 80    Temp 98.5 F (36.9 C) (Oral)    Resp 18    LMP 05/31/2021 (Exact Date)    SpO2 98%    Physical Exam Constitutional:      General: She is not in acute distress.    Appearance: Normal appearance. She is well-developed. She is not ill-appearing, toxic-appearing or diaphoretic.  HENT:     Head: Normocephalic and atraumatic.     Nose: Nose normal.     Mouth/Throat:     Mouth: Mucous membranes are moist.  Eyes:     General: No scleral icterus.       Right eye: No discharge.        Left eye: No discharge.     Extraocular Movements: Extraocular movements intact.  Cardiovascular:     Rate and Rhythm: Normal rate.  Pulmonary:     Effort: Pulmonary effort is normal.  Musculoskeletal:     Left foot: Decreased range of motion. Normal capillary refill. Swelling, laceration, tenderness and bony tenderness present. No deformity or crepitus.       Feet:  Skin:    General: Skin is warm and dry.  Neurological:     General: No focal deficit present.     Mental Status: She is alert and oriented to person, place, and time.  Psychiatric:        Mood and Affect: Mood normal.  Behavior: Behavior normal.    DG Foot Complete Left  Result Date: 06/05/2021 CLINICAL DATA:  Pain and swelling fourth toe, struck toe on bed last night EXAM: LEFT FOOT - COMPLETE 3+ VIEW COMPARISON:  None FINDINGS: Osseous mineralization normal. Joint spaces preserved. No acute fracture, dislocation, or bone destruction. IMPRESSION: No acute osseous abnormalities. Electronically Signed   By: Ulyses Southward M.D.   On: 06/05/2021 14:04     Assessment and Plan :   PDMP not reviewed this encounter.  1. Contusion of fourth toe of left foot, initial encounter   2. Toe pain, left   3. Superficial laceration of left foot, initial encounter    Counseled on general management of the left toe contusion including use of dressings to the superficial laceration in the postop shoe.  Naproxen for pain and inflammation. Counseled patient on potential for adverse effects with medications prescribed/recommended today,  ER and return-to-clinic precautions discussed, patient verbalized understanding.    Wallis Bamberg, [place] 06/05/21 1408

## 2021-06-05 NOTE — ED Triage Notes (Signed)
Hit left 4th toe on bed last night.

## 2021-07-10 ENCOUNTER — Encounter: Payer: Self-pay | Admitting: Physician Assistant

## 2021-07-10 ENCOUNTER — Telehealth (INDEPENDENT_AMBULATORY_CARE_PROVIDER_SITE_OTHER): Payer: BC Managed Care – PPO | Admitting: Physician Assistant

## 2021-07-10 DIAGNOSIS — F411 Generalized anxiety disorder: Secondary | ICD-10-CM

## 2021-07-10 DIAGNOSIS — G4700 Insomnia, unspecified: Secondary | ICD-10-CM

## 2021-07-10 DIAGNOSIS — F32A Depression, unspecified: Secondary | ICD-10-CM

## 2021-07-10 NOTE — Progress Notes (Signed)
[facility] ? ?Patient ID: Laycee Poon,  ?MRN: [id number] ? ?PCP: Assunta Found, MD ? ?Date of Evaluation: 07/10/2021 ?Time spent:20 minutes ? ?Chief Complaint:  ?Chief Complaint   ?Depression; Insomnia; Follow-up ?  ? ?Virtual Visit via Telehealth ? ?I connected with patient by a video enabled telemedicine application with their informed consent, and verified patient privacy and that I am speaking with the correct person using two identifiers.  I am private, in my office and the patient is at work. ? ?I discussed the limitations, risks, security and privacy concerns of performing an evaluation and management service by telephone video and the availability of in person appointments. I also discussed with the patient that there may be a patient responsible charge related to this service. The patient expressed understanding and agreed to proceed. ?  ?I discussed the assessment and treatment plan with the patient. The patient was provided an opportunity to ask questions and all were answered. The patient agreed with the plan and demonstrated an understanding of the instructions. ?  ?The patient was advised to call back or seek an in-person evaluation if the symptoms worsen or if the condition fails to improve as anticipated. ? ?I provided 20  minutes of non-face-to-face time during this encounter. ? ?HISTORY/CURRENT STATUS: ?Here for 6-week follow-up. ? ?Lexapro was increased at the last visit.  She states she is probably 85 to 90% better now than she was when we started the Lexapro approximately 3 months ago.  The anxiety is almost nonexistent now. When she drinks alcohol, she is anxious the next day.  She has cut down on alcohol consumption because of this.  She is not having panic attacks at all and when she does feel anxious it is more generalized than anything.  The Lexapro has helped a lot.  She is no longer having any GI side effects from it.  No other side effects reported. ? ?Is able to enjoy  things.  Denies decreased energy or motivation.  Appetite has not changed.  No extreme sadness, tearfulness, or feelings of hopelessness.  Denies any changes in concentration, making decisions or remembering things.  Sleeping well using the trazodone 50 mg nightly.  Work is going well.  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  Denies suicidal or homicidal thoughts. ? ?Denies dizziness, syncope, seizures, numbness, tingling, tremor, tics, unsteady gait, slurred speech, confusion. Denies muscle or joint pain, stiffness, or dystonia. ? ?Individual Medical History/ Review of Systems: Changes? :No   ? ?Past medications for mental health diagnoses include: ?Zoloft, Trazodone, Buspar probably wasn't effective (she only took for 2 months though) ? ?Allergies: Patient has no known allergies. ? ?Current Medications:  ?Current Outpatient Medications:  ?  cetirizine (ZYRTEC) 10 MG tablet, Take 10 mg by mouth daily., Disp: , Rfl:  ?  escitalopram (LEXAPRO) 10 MG tablet, Take 1 tablet (10 mg total) by mouth daily., Disp: 90 tablet, Rfl: 1 ?  traZODone (DESYREL) 50 MG tablet, Take 1-2 tablets (50-100 mg total) by mouth at bedtime as needed., Disp: 180 tablet, Rfl: 1 ?  naproxen (NAPROSYN) 375 MG tablet, Take 1 tablet (375 mg total) by mouth 2 (two) times daily with a meal. (Patient not taking: Reported on 07/10/2021), Disp: 30 tablet, Rfl: 0 ?Medication Side Effects: none ? ?Family Medical/ Social History: Changes? no ? ?MENTAL HEALTH EXAM: ? ?There were no vitals taken for this visit.There is no height or weight on file to calculate BMI.  ?General Appearance: Casual, Neat and Well  Groomed  ?Eye Contact:  Good  ?Speech:  Clear and Coherent  ?Volume:  Normal  ?Mood:  Euthymic  ?Affect:  Congruent  ?Thought Process:  Goal Directed and Descriptions of Associations: Intact  ?Orientation:  Full (Time, Place, and Person)  ?Thought Content: Logical   ?Suicidal Thoughts:  No  ?Homicidal Thoughts:  No  ?Memory:  WNL  ?Judgement:  Good   ?Insight:  Good  ?Psychomotor Activity:  Normal  ?Concentration:  Concentration: Good and Attention Span: Fair  ?Recall:  Good  ?Fund of Knowledge: Good  ?Language: Good  ?Assets:  Desire for Improvement  ?ADL's:  Intact  ?Cognition: WNL  ?Prognosis:  Good  ? ? ?DIAGNOSES:  ?  ICD-10-CM   ?1. Generalized anxiety disorder  F41.1   ?  ?2. Insomnia, unspecified type  G47.00   ?  ?3. Mild depression  F32.A   ?  ? ? ?Receiving Psychotherapy: No  ? ? ?RECOMMENDATIONS:  ?PDMP was reviewed. No results available.  ?I provided 20 minutes of non-face to face time during this encounter, including time spent before and after the visit in records review, medical decision making, counseling pertinent to today's visit, and charting.  ?I am glad to hear that she is doing better!  No changes in medications are necessary. ? ?Continue Lexapro 10 mg, 1 p.o. daily. ?Continue trazodone 50 mg, 1-2 p.o. nightly as needed sleep. ?Return in late July or early August, before her refills are due. ? ?Donnal Moat, PA-C  ?

## 2021-11-13 ENCOUNTER — Telehealth: Payer: Self-pay | Admitting: Physician Assistant

## 2021-11-13 ENCOUNTER — Other Ambulatory Visit: Payer: Self-pay

## 2021-11-13 MED ORDER — ESCITALOPRAM OXALATE 10 MG PO TABS: 10.0000 mg | ORAL_TABLET | Freq: Every day | ORAL | 0 refills | Status: DC

## 2021-11-13 NOTE — Telephone Encounter (Signed)
Pt called and said that she needs a refill on her lexapro 10 mg. She has a new pharmacy to send it too. [facility] located [address] [place] [address]

## 2021-11-13 NOTE — Telephone Encounter (Signed)
Please schedule appt

## 2021-11-14 ENCOUNTER — Telehealth: Payer: Self-pay | Admitting: Physician Assistant

## 2021-11-14 ENCOUNTER — Other Ambulatory Visit: Payer: Self-pay

## 2021-11-14 MED ORDER — ESCITALOPRAM OXALATE 10 MG PO TABS: 10.0000 mg | ORAL_TABLET | Freq: Every day | ORAL | 0 refills | Status: DC

## 2021-11-14 NOTE — Telephone Encounter (Signed)
Rx sent 

## 2021-11-14 NOTE — Telephone Encounter (Signed)
Jennifer Wise called to make appt.  Scheduled for 8/21.  Needs refill of her Lexapro.  Send to [facility] on [address] [place], [place]

## 2021-11-14 NOTE — Telephone Encounter (Signed)
Lvm for pt to call and schedule

## 2021-12-01 ENCOUNTER — Encounter: Payer: Self-pay | Admitting: Physician Assistant

## 2021-12-01 ENCOUNTER — Telehealth (INDEPENDENT_AMBULATORY_CARE_PROVIDER_SITE_OTHER): Payer: BC Managed Care – PPO | Admitting: Physician Assistant

## 2021-12-01 DIAGNOSIS — F411 Generalized anxiety disorder: Secondary | ICD-10-CM

## 2021-12-01 DIAGNOSIS — G4700 Insomnia, unspecified: Secondary | ICD-10-CM | POA: Diagnosis not present

## 2021-12-01 MED ORDER — TRAZODONE HCL 50 MG PO TABS: 50.0000 mg | ORAL_TABLET | Freq: Every evening | ORAL | 1 refills | Status: DC | PRN

## 2021-12-01 MED ORDER — ESCITALOPRAM OXALATE 10 MG PO TABS: 15.0000 mg | ORAL_TABLET | Freq: Every day | ORAL | 1 refills | Status: DC

## 2021-12-01 MED ORDER — HYDROXYZINE HCL 10 MG PO TABS: 5.0000 mg | ORAL_TABLET | Freq: Three times a day (TID) | ORAL | 1 refills | Status: DC | PRN

## 2021-12-01 NOTE — Progress Notes (Signed)
[facility]  Patient ID: Jennifer Wise,  MRN: [id number]  PCP: Jennifer Found, MD  Date of Evaluation: 12/01/2021 Time spent:20 minutes  Chief Complaint:  Chief Complaint   Anxiety; Follow-up    Virtual Visit via Telehealth  I connected with patient by a video enabled telemedicine application with their informed consent, and verified patient privacy and that I am speaking with the correct person using two identifiers.  I am private, in my office and the patient is at school in [place].  I discussed the limitations, risks, security and privacy concerns of performing an evaluation and management service by video and the availability of in person appointments. I also discussed with the patient that there may be a patient responsible charge related to this service. The patient expressed understanding and agreed to proceed.   I discussed the assessment and treatment plan with the patient. The patient was provided an opportunity to ask questions and all were answered. The patient agreed with the plan and demonstrated an understanding of the instructions.   The patient was advised to call back or seek an in-person evaluation if the symptoms worsen or if the condition fails to improve as anticipated.  I provided 20  minutes of non-face-to-face time during this encounter.  HISTORY/CURRENT STATUS: for routine med check.  Has been doing pretty well overall.  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  Her main problem is more anxiety, just generalized, not having panic attacks but sometimes she cannot get things off her mind.  Worries a lot and obsesses about things.  Patient is able to enjoy things.  Energy and motivation are good.  No extreme sadness, tearfulness, or feelings of hopelessness.  Sleeps well most of the time but does take the trazodone most nights.  ADLs and personal hygiene are normal.   Denies any changes in concentration, making decisions, or  remembering things.  Appetite has not changed.  Weight is stable.  Denies laxative use, calorie restricting, or binging and purging.   Denies cutting or any form of self-harm.  Denies suicidal or homicidal thoughts.  Patient denies increased energy with decreased need for sleep, increased talkativeness, racing thoughts, impulsivity or risky behaviors, increased spending, increased libido, grandiosity, increased irritability or anger, paranoia, and no hallucinations.  Denies dizziness, syncope, seizures, numbness, tingling, tremor, tics, unsteady gait, slurred speech, confusion. Denies muscle or joint pain, stiffness, or dystonia.  Individual Medical History/ Review of Systems: Changes? :No    Past medications for mental health diagnoses include: Zoloft, Trazodone, Buspar probably wasn't effective (she only took for 2 months though)  Allergies: Patient has no known allergies.  Current Medications:  Current Outpatient Medications:    cetirizine (ZYRTEC) 10 MG tablet, Take 10 mg by mouth daily., Disp: , Rfl:    hydrOXYzine (ATARAX) 10 MG tablet, Take 0.5-3 tablets (5-30 mg total) by mouth 3 (three) times daily as needed., Disp: 60 tablet, Rfl: 1   escitalopram (LEXAPRO) 10 MG tablet, Take 1.5 tablets (15 mg total) by mouth daily., Disp: 135 tablet, Rfl: 1   naproxen (NAPROSYN) 375 MG tablet, Take 1 tablet (375 mg total) by mouth 2 (two) times daily with a meal. (Patient not taking: Reported on 07/10/2021), Disp: 30 tablet, Rfl: 0   traZODone (DESYREL) 50 MG tablet, Take 1-2 tablets (50-100 mg total) by mouth at bedtime as needed., Disp: 180 tablet, Rfl: 1 Medication Side Effects: none  Family Medical/ Social History: Changes? no  MENTAL HEALTH EXAM:  There were  no vitals taken for this visit.There is no height or weight on file to calculate BMI.  General Appearance: Casual, Neat and Well Groomed  Eye Contact:  Good  Speech:  Clear and Coherent  Volume:  Normal  Mood:  Euthymic  Affect:   Congruent  Thought Process:  Goal Directed and Descriptions of Associations: Intact  Orientation:  Full (Time, Place, and Person)  Thought Content: Logical and Rumination   Suicidal Thoughts:  No  Homicidal Thoughts:  No  Memory:  WNL  Judgement:  Good  Insight:  Good  Psychomotor Activity:  Normal  Concentration:  Concentration: Good and Attention Span: Good  Recall:  Good  Fund of Knowledge: Good  Language: Good  Assets:  Desire for Improvement  ADL's:  Intact  Cognition: WNL  Prognosis:  Good    DIAGNOSES:    ICD-10-CM   1. Generalized anxiety disorder  F41.1     2. Insomnia, unspecified type  G47.00      Receiving Psychotherapy: No   RECOMMENDATIONS:  PDMP was reviewed. No results available.  I provided 20 minutes of non-face to face time during this encounter, including time spent before and after the visit in records review, medical decision making, counseling pertinent to today's visit, and charting.   We discussed the uptake and anxiety.  She has been on the current dose of Lexapro for a while now so she may have tolerance to it.  I recommend increasing it.  Also recommend adding hydroxyzine for as needed use.  She is not having panic attacks but the ruminating thoughts are difficult to push through at times.  Patient would like to have it on hand just in case she needs it.  Benefits, risks and side effects were discussed and she accepts.  She knows that trazodone may not be needed if she takes the hydroxyzine in the evening.  Increase Lexapro 10 mg to 1.5 pills daily. Start hydroxyzine 10 mg, 1/2-3 p.o. 3 times daily as needed anxiety. Continue trazodone 50 mg, 1-2 p.o. nightly as needed sleep. Return in 6 weeks.  Jennifer Overly, PA-C

## 2022-01-12 ENCOUNTER — Encounter: Payer: Self-pay | Admitting: Physician Assistant

## 2022-01-12 ENCOUNTER — Telehealth (INDEPENDENT_AMBULATORY_CARE_PROVIDER_SITE_OTHER): Payer: BC Managed Care – PPO | Admitting: Physician Assistant

## 2022-01-12 DIAGNOSIS — F32A Depression, unspecified: Secondary | ICD-10-CM

## 2022-01-12 DIAGNOSIS — G4700 Insomnia, unspecified: Secondary | ICD-10-CM

## 2022-01-12 DIAGNOSIS — F411 Generalized anxiety disorder: Secondary | ICD-10-CM | POA: Diagnosis not present

## 2022-01-12 MED ORDER — ESCITALOPRAM OXALATE 10 MG PO TABS: 20.0000 mg | ORAL_TABLET | Freq: Every day | ORAL | 0 refills | Status: DC

## 2022-01-12 MED ORDER — ALPRAZOLAM 0.25 MG PO TABS: 0.2500 mg | ORAL_TABLET | Freq: Two times a day (BID) | ORAL | 0 refills | Status: DC | PRN

## 2022-01-12 NOTE — Progress Notes (Signed)
[facility]  Patient ID: Jennifer Wise,  MRN: [id number]  PCP: Sharilyn Sites, MD  Date of Evaluation: 01/14/2022 Time spent:20 minutes  Chief Complaint:  Chief Complaint   Depression; Anxiety; Insomnia; Follow-up    Virtual Visit via Telehealth  I connected with patient by a video enabled telemedicine application (unable to connect through video therefore appointment completed through audio) with their informed consent, and verified patient privacy and that I am speaking with the correct person using two identifiers.  I am private, in my office and the patient is at school in [place].  I discussed the limitations, risks, security and privacy concerns of performing an evaluation and management service by audio and the availability of in person appointments. I also discussed with the patient that there may be a patient responsible charge related to this service. The patient expressed understanding and agreed to proceed.   I discussed the assessment and treatment plan with the patient. The patient was provided an opportunity to ask questions and all were answered. The patient agreed with the plan and demonstrated an understanding of the instructions.   The patient was advised to call back or seek an in-person evaluation if the symptoms worsen or if the condition fails to improve as anticipated.  I provided 20  minutes of non-face-to-face time during this encounter.  HISTORY/CURRENT STATUS: for routine med check.  We increased the Lexapro and added hydroxyzine 6 weeks ago.  States the generalized anxiety is slightly better.  But the hydroxyzine is not helping at all.  She has taken 2 at a time and does not feel a thing.  It does not cause drowsiness either. Gets panicky, feels overwhelmed a lot, no palpitations or SOB. Mind races and she can't stop obsessing about things. Worse in the evenings, Trazodone does help w/ that problem so she can go to sleep.   Patient is able to  enjoy things.  Energy and motivation are good.  School is going well.  No extreme sadness, tearfulness, or feelings of hopelessness.  ADLs and personal hygiene are normal.   Denies any changes in concentration, making decisions, or remembering things.  Appetite has not changed.  Weight is stable.  No reports of cutting or any form of self-harm.  Not isolating.  Not crying easily.  Denies suicidal or homicidal thoughts.  Patient denies increased energy with decreased need for sleep, increased talkativeness, racing thoughts, impulsivity or risky behaviors, increased spending, increased libido, grandiosity, increased irritability or anger, paranoia, or hallucinations.  Denies dizziness, syncope, seizures, numbness, tingling, tremor, tics, unsteady gait, slurred speech, confusion. Denies muscle or joint pain, stiffness, or dystonia.  Individual Medical History/ Review of Systems: Changes? :No    Past medications for mental health diagnoses include: Zoloft, Trazodone, Buspar probably wasn't effective (she only took for 2 months though)  Allergies: Patient has no known allergies.  Current Medications:  Current Outpatient Medications:    ALPRAZolam (XANAX) 0.25 MG tablet, Take 1 tablet (0.25 mg total) by mouth 2 (two) times daily as needed for anxiety., Disp: 15 tablet, Rfl: 0   cetirizine (ZYRTEC) 10 MG tablet, Take 10 mg by mouth daily., Disp: , Rfl:    hydrOXYzine (ATARAX) 10 MG tablet, Take 0.5-3 tablets (5-30 mg total) by mouth 3 (three) times daily as needed., Disp: 60 tablet, Rfl: 1   traZODone (DESYREL) 50 MG tablet, Take 1-2 tablets (50-100 mg total) by mouth at bedtime as needed., Disp: 180 tablet, Rfl: 1   escitalopram (LEXAPRO) 10 MG tablet,  Take 2 tablets (20 mg total) by mouth daily., Disp: 180 tablet, Rfl: 0 Medication Side Effects: none  Family Medical/ Social History: Changes? no  MENTAL HEALTH EXAM:  There were no vitals taken for this visit.There is no height or weight on file  to calculate BMI.  General Appearance:  Unable to assess  Eye Contact:   Unable to assess  Speech:  Clear and Coherent  Volume:  Normal  Mood:  Euthymic  Affect:   Unable to assess  Thought Process:  Goal Directed and Descriptions of Associations: Intact  Orientation:  Full (Time, Place, and Person)  Thought Content: Logical and Rumination   Suicidal Thoughts:  No  Homicidal Thoughts:  No  Memory:  WNL  Judgement:  Good  Insight:  Good  Psychomotor Activity:   Unable to assess  Concentration:  Concentration: Good and Attention Span: Good  Recall:  Good  Fund of Knowledge: Good  Language: Good  Assets:  Desire for Improvement  ADL's:  Intact  Cognition: WNL  Prognosis:  Good   DIAGNOSES:    ICD-10-CM   1. Generalized anxiety disorder  F41.1     2. Mild depression  F32.A     3. Insomnia, unspecified type  G47.00       Receiving Psychotherapy: No   RECOMMENDATIONS:  PDMP was reviewed. No results available.  I provided 20 minutes of non-face-to-face time during this encounter, including time spent before and after the visit in records review, medical decision making, counseling pertinent to today's visit, and charting.   We discussed her symptoms.  I recommend increasing the Lexapro to the usual maximum dose.  That should help with the generalized anxiety.  Since the hydroxyzine is not very helpful I recommend adding a benzodiazepine, I will only give her a few when she should take sparingly, when she starts feeling super anxious or knows that she will be in a situation that usually causes that.  She understands that these drugs can be habit forming and addiction can occur.  She was told not to drink when she takes this medication, not to drive when she takes 1 due to possible drowsiness and would be seen as driving under the influence. We discussed the short-term and long-term risks of benzodiazepines including sedation, increased risk of falling, dizziness, and tolerance.   Patient understands and accepts.   Start Xanax 0.25 mg, 1 p.o. twice daily as needed anxiety. Increase Lexapro 10 mg to 2 p.o. daily.  (When she needs the refill next time I will send in 20 mg pills.) Continue trazodone 50 mg, 1-2 p.o. nightly as needed sleep. Recommend counseling. Return in 6 weeks.  Melony Overly, PA-C

## 2022-02-25 ENCOUNTER — Telehealth (INDEPENDENT_AMBULATORY_CARE_PROVIDER_SITE_OTHER): Payer: BC Managed Care – PPO | Admitting: Physician Assistant

## 2022-02-25 ENCOUNTER — Encounter: Payer: Self-pay | Admitting: Physician Assistant

## 2022-02-25 DIAGNOSIS — F32A Depression, unspecified: Secondary | ICD-10-CM

## 2022-02-25 DIAGNOSIS — F411 Generalized anxiety disorder: Secondary | ICD-10-CM

## 2022-02-25 DIAGNOSIS — G4700 Insomnia, unspecified: Secondary | ICD-10-CM | POA: Diagnosis not present

## 2022-02-25 NOTE — Progress Notes (Signed)
[facility]  Patient ID: Jennifer Wise,  MRN: [id number]  PCP: Assunta Found, MD  Date of Evaluation: 02/25/2022 Time spent:20 minutes  Chief Complaint:  Chief Complaint   Anxiety; Depression; Insomnia; Follow-up   Virtual Visit via Telehealth  I connected with patient by a video enabled telemedicine application with their informed consent, and verified patient privacy and that I am speaking with the correct person using two identifiers.  I am private, in my office and the patient is in her car.  I discussed the limitations, risks, security and privacy concerns of performing an evaluation and management service by video and the availability of in person appointments. I also discussed with the patient that there may be a patient responsible charge related to this service. The patient expressed understanding and agreed to proceed.   I discussed the assessment and treatment plan with the patient. The patient was provided an opportunity to ask questions and all were answered. The patient agreed with the plan and demonstrated an understanding of the instructions.   The patient was advised to call back or seek an in-person evaluation if the symptoms worsen or if the condition fails to improve as anticipated.  I provided 20  minutes of non-face-to-face time during this encounter.  HISTORY/CURRENT STATUS: for routine med check.  Six weeks ago we increased the Lexapro and added Xanax.  States she is feeling a lot better.  She has probably taken only 5 of the Xanax in the past 6 weeks.  She had a huge presentation for one of her classes and took it before that, which was extremely helpful.  She is not having panic attacks and does not feel as overwhelmed as she did.  She is sleeping pretty well, not having ruminating thoughts.  Trazodone does help when needed.  Has started counseling and feels like that is helping as well.  Patient is able to enjoy things.  Energy and motivation are  good.  School is going well.  No extreme sadness, tearfulness, or feelings of hopelessness. ADLs and personal hygiene are normal.   Denies any changes in concentration, making decisions, or remembering things.  Appetite has not changed.  Weight is stable.  Denies laxative use, calorie restricting, or binging and purging.   Denies cutting or any form of self-harm.  Denies suicidal or homicidal thoughts.  Patient denies increased energy with decreased need for sleep, increased talkativeness, racing thoughts, impulsivity or risky behaviors, increased spending, increased libido, grandiosity, increased irritability or anger, paranoia, or hallucinations.  Denies dizziness, syncope, seizures, numbness, tingling, tremor, tics, unsteady gait, slurred speech, confusion. Denies muscle or joint pain, stiffness, or dystonia.  Individual Medical History/ Review of Systems: Changes? :No    Past medications for mental health diagnoses include: Zoloft, Trazodone, Buspar probably wasn't effective (she only took for 2 months though)  Allergies: Patient has no known allergies.  Current Medications:  Current Outpatient Medications:    ALPRAZolam (XANAX) 0.25 MG tablet, Take 1 tablet (0.25 mg total) by mouth 2 (two) times daily as needed for anxiety., Disp: 15 tablet, Rfl: 0   escitalopram (LEXAPRO) 10 MG tablet, Take 2 tablets (20 mg total) by mouth daily., Disp: 180 tablet, Rfl: 0   traZODone (DESYREL) 50 MG tablet, Take 1-2 tablets (50-100 mg total) by mouth at bedtime as needed., Disp: 180 tablet, Rfl: 1   cetirizine (ZYRTEC) 10 MG tablet, Take 10 mg by mouth daily. (Patient not taking: Reported on 02/25/2022), Disp: , Rfl:  Medication Side Effects: none  Family Medical/ Social History: Changes? no  MENTAL HEALTH EXAM:  There were no vitals taken for this visit.There is no height or weight on file to calculate BMI.  General Appearance: Casual and Well Groomed  Eye Contact:  Good  Speech:  Clear and  Coherent  Volume:  Normal  Mood:  Euthymic  Affect:  Congruent  Thought Process:  Goal Directed and Descriptions of Associations: Intact  Orientation:  Full (Time, Place, and Person)  Thought Content: Logical   Suicidal Thoughts:  No  Homicidal Thoughts:  No  Memory:  WNL  Judgement:  Good  Insight:  Good  Psychomotor Activity:  Normal  Concentration:  Concentration: Good and Attention Span: Good  Recall:  Good  Fund of Knowledge: Good  Language: Good  Assets:  Desire for Improvement  ADL's:  Intact  Cognition: WNL  Prognosis:  Good   DIAGNOSES:  No diagnosis found.  Receiving Psychotherapy: Yes   RECOMMENDATIONS:  PDMP was reviewed. Xanax I provided 20 minutes of non-face-to-face time during this encounter, including time spent before and after the visit in records review, medical decision making, counseling pertinent to today's visit, and charting.   She is doing much better as far as the situational anxiety goes so no changes will be made.  We again discussed the addictive/tolerance potential with the Xanax so she should use only in emergencies.  She understands.   Continue Xanax 0.25 mg, 1 p.o. twice daily as needed anxiety. Cont Lexapro 10 mg, 2 p.o. daily.  Continue trazodone 50 mg, 1-2 p.o. nightly as needed sleep. Continue counseling. Return in 2 months.  Melony Overly, PA-C

## 2022-04-14 ENCOUNTER — Other Ambulatory Visit (INDEPENDENT_AMBULATORY_CARE_PROVIDER_SITE_OTHER): Payer: BC Managed Care – PPO

## 2022-04-14 ENCOUNTER — Other Ambulatory Visit (HOSPITAL_COMMUNITY)
Admission: RE | Admit: 2022-04-14 | Discharge: 2022-04-14 | Disposition: A | Discharge status: Home / Self Care | Payer: BC Managed Care – PPO | Source: Ambulatory Visit | Attending: Obstetrics & Gynecology | Admitting: Obstetrics & Gynecology

## 2022-04-14 DIAGNOSIS — Z113 Encounter for screening for infections with a predominantly sexual mode of transmission: Secondary | ICD-10-CM | POA: Insufficient documentation

## 2022-04-14 DIAGNOSIS — B3731 Acute candidiasis of vulva and vagina: Secondary | ICD-10-CM | POA: Insufficient documentation

## 2022-04-14 DIAGNOSIS — N938 Other specified abnormal uterine and vaginal bleeding: Secondary | ICD-10-CM | POA: Insufficient documentation

## 2022-04-14 NOTE — Progress Notes (Signed)
   NURSE VISIT- VAGINITIS/STD/POC  SUBJECTIVE:  Jennifer Wise is a 22 y.o. G0P0000 GYN patientfemale here for a vaginal swab for STD/vaginitis screen.  She reports the following symptoms: abnormal bleeding: flow is light for 1 week. Denies significant pelvic pain, fever, or UTI symptoms.  OBJECTIVE:  There were no vitals taken for this visit.  Appears well, in no apparent distress  ASSESSMENT: Vaginal swab for STD screen  PLAN: Self-collected vaginal probe for Gonorrhea, Chlamydia, Trichomonas, Bacterial Vaginosis, Yeast sent to lab Treatment: to be determined once results are received Follow-up as needed if symptoms persist/worsen, or new symptoms develop  Andrez Grime  04/14/2022 9:25 AM

## 2022-04-15 LAB — CERVICOVAGINAL ANCILLARY ONLY
Bacterial Vaginitis (gardnerella): NEGATIVE
Candida Glabrata: NEGATIVE
Candida Vaginitis: POSITIVE — AB
Chlamydia: NEGATIVE
Comment: NEGATIVE
Comment: NEGATIVE
Comment: NEGATIVE
Comment: NEGATIVE
Comment: NEGATIVE
Comment: NORMAL
Neisseria Gonorrhea: NEGATIVE
Trichomonas: NEGATIVE

## 2022-04-29 ENCOUNTER — Encounter: Payer: Self-pay | Admitting: Physician Assistant

## 2022-04-29 ENCOUNTER — Telehealth (INDEPENDENT_AMBULATORY_CARE_PROVIDER_SITE_OTHER): Payer: BC Managed Care – PPO | Admitting: Physician Assistant

## 2022-04-29 DIAGNOSIS — F32A Depression, unspecified: Secondary | ICD-10-CM

## 2022-04-29 DIAGNOSIS — G4700 Insomnia, unspecified: Secondary | ICD-10-CM

## 2022-04-29 DIAGNOSIS — F411 Generalized anxiety disorder: Secondary | ICD-10-CM | POA: Diagnosis not present

## 2022-04-29 MED ORDER — ESCITALOPRAM OXALATE 20 MG PO TABS: 20.0000 mg | ORAL_TABLET | Freq: Every day | ORAL | 1 refills | Status: DC

## 2022-04-29 NOTE — Progress Notes (Signed)
[facility]  Patient ID: Jennifer Wise,  MRN: [id number]  PCP: Sharilyn Sites, MD  Date of Evaluation: 04/29/2022 Time spent:20 minutes  Chief Complaint:  Chief Complaint   Anxiety; Depression; Follow-up    Virtual Visit via Telehealth  I connected with patient by a video enabled telemedicine application with their informed consent, and verified patient privacy and that I am speaking with the correct person using two identifiers.  I am private, in my office and the patient is in her dorm room in [place].  I discussed the limitations, risks, security and privacy concerns of performing an evaluation and management service by video and the availability of in person appointments. I also discussed with the patient that there may be a patient responsible charge related to this service. The patient expressed understanding and agreed to proceed.   I discussed the assessment and treatment plan with the patient. The patient was provided an opportunity to ask questions and all were answered. The patient agreed with the plan and demonstrated an understanding of the instructions.   The patient was advised to call back or seek an in-person evaluation if the symptoms worsen or if the condition fails to improve as anticipated.  I provided 20  minutes of non-face-to-face time during this encounter.  HISTORY/CURRENT STATUS: for routine med check.  She is doing very well.  She has only taken the Xanax maybe 5 times in the past 3 months.  Increasing the Lexapro 3 months ago was very helpful for both anxiety and depression.  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 Patient is able to enjoy things.  Energy and motivation are good.   No extreme sadness, tearfulness, or feelings of hopelessness.  Sleeps well most of the time, she does take trazodone 25 mg some nights and it is effective. ADLs and personal hygiene are normal.    Denies any changes in concentration, making decisions, or remembering things.  Appetite has not changed.  Weight is stable.   Denies suicidal or homicidal thoughts.  Patient denies increased energy with decreased need for sleep, increased talkativeness, racing thoughts, impulsivity or risky behaviors, increased spending, increased libido, grandiosity, increased irritability or anger, paranoia, or hallucinations.  Denies dizziness, syncope, seizures, numbness, tingling, tremor, tics, unsteady gait, slurred speech, confusion. Denies muscle or joint pain, stiffness, or dystonia.  Individual Medical History/ Review of Systems: Changes? :No    Past medications for mental health diagnoses include: Zoloft, Trazodone, Buspar probably wasn't effective (she only took for 2 months though)  Allergies: Patient has no known allergies.  Current Medications:  Current Outpatient Medications:    escitalopram (LEXAPRO) 20 MG tablet, Take 1 tablet (20 mg total) by mouth daily., Disp: 90 tablet, Rfl: 1   traZODone (DESYREL) 50 MG tablet, Take 1-2 tablets (50-100 mg total) by mouth at bedtime as needed., Disp: 180 tablet, Rfl: 1   ALPRAZolam (XANAX) 0.25 MG tablet, Take 1 tablet (0.25 mg total) by mouth 2 (two) times daily as needed for anxiety. (Patient not taking: Reported on 04/29/2022), Disp: 15 tablet, Rfl: 0   cetirizine (ZYRTEC) 10 MG tablet, Take 10 mg by mouth daily. (Patient not taking: Reported on 02/25/2022), Disp: , Rfl:  Medication Side Effects: none  Family Medical/ Social History: Changes? no  MENTAL HEALTH EXAM:  There were no vitals taken for this visit.There is no height or weight on file to calculate BMI.  General Appearance: Casual and  Well Groomed  Eye Contact:  Good  Speech:  Clear and Coherent  Volume:  Normal  Mood:  Euthymic  Affect:  Congruent  Thought Process:  Goal Directed and Descriptions of Associations: Intact  Orientation:  Full (Time, Place, and Person)  Thought Content:  Logical   Suicidal Thoughts:  No  Homicidal Thoughts:  No  Memory:  WNL  Judgement:  Good  Insight:  Good  Psychomotor Activity:  Normal  Concentration:  Concentration: Good and Attention Span: Good  Recall:  Good  Fund of Knowledge: Good  Language: Good  Assets:  Desire for Improvement Financial Resources/Insurance Housing Transportation Vocational/Educational  ADL's:  Intact  Cognition: WNL  Prognosis:  Good   DIAGNOSES:    ICD-10-CM   1. Generalized anxiety disorder  F41.1     2. Mild depression  F32.A     3. Insomnia, unspecified type  G47.00      Receiving Psychotherapy: Yes   RECOMMENDATIONS:  PDMP was reviewed.  Xanax filled 01/12/2022. I provided 20 minutes of non-face-to-face time during this encounter, including time spent before and after the visit in records review, medical decision making, counseling pertinent to today's visit, and charting.   Jaslyn is doing well so no changes needed.   Continue Xanax 0.25 mg, 1 p.o. twice daily as needed anxiety. Change Lexapro to 20 mg pills, 1 p.o. daily.  Continue trazodone 50 mg, 1/2-2 p.o. nightly as needed sleep. Continue counseling. Return in 6 months.  Donnal Moat, PA-C

## 2022-10-28 ENCOUNTER — Telehealth: Payer: BC Managed Care – PPO | Admitting: Physician Assistant

## 2022-12-15 ENCOUNTER — Other Ambulatory Visit: Payer: Self-pay

## 2022-12-15 ENCOUNTER — Telehealth: Payer: Self-pay | Admitting: Physician Assistant

## 2022-12-15 MED ORDER — ALPRAZOLAM 0.25 MG PO TABS: 0.2500 mg | ORAL_TABLET | Freq: Two times a day (BID) | ORAL | 0 refills | Status: DC | PRN

## 2022-12-15 NOTE — Telephone Encounter (Signed)
Pended.

## 2022-12-15 NOTE — Telephone Encounter (Signed)
Pt called today to request RF on Xanax. She would like this sent in to: [id number], [place] - [address] AT [facility] [address]  Has appt made for telehealth on Oct 11,2024

## 2023-01-22 ENCOUNTER — Encounter: Payer: Self-pay | Admitting: Physician Assistant

## 2023-01-22 ENCOUNTER — Telehealth: Payer: BC Managed Care – PPO | Admitting: Physician Assistant

## 2023-01-22 DIAGNOSIS — F32A Depression, unspecified: Secondary | ICD-10-CM | POA: Diagnosis not present

## 2023-01-22 DIAGNOSIS — F411 Generalized anxiety disorder: Secondary | ICD-10-CM

## 2023-01-22 DIAGNOSIS — G4700 Insomnia, unspecified: Secondary | ICD-10-CM | POA: Diagnosis not present

## 2023-01-22 MED ORDER — ESCITALOPRAM OXALATE 20 MG PO TABS: 20.0000 mg | ORAL_TABLET | Freq: Every day | ORAL | 1 refills | Status: AC

## 2023-01-22 MED ORDER — TRAZODONE HCL 50 MG PO TABS: 50.0000 mg | ORAL_TABLET | Freq: Every evening | ORAL | 1 refills | Status: AC | PRN

## 2023-01-22 MED ORDER — ALPRAZOLAM 0.5 MG PO TABS: 0.5000 mg | ORAL_TABLET | Freq: Every day | ORAL | 0 refills | Status: AC | PRN

## 2023-01-22 NOTE — Progress Notes (Signed)
[facility]  Patient ID: Jennifer Wise,  MRN: [id number]  PCP: Assunta Found, MD  Date of Evaluation: 01/22/2023 Time spent: 21 minutes  Chief Complaint:  Chief Complaint   Anxiety; Depression; Follow-up    Virtual Visit via Telehealth  I connected with patient by a video enabled telemedicine application with their informed consent, and verified patient privacy and that I am speaking with the correct person using two identifiers.  I am private, in my office and the patient is in her dorm room in [place].  I discussed the limitations, risks, security and privacy concerns of performing an evaluation and management service by video and the availability of in person appointments. I also discussed with the patient that there may be a patient responsible charge related to this service. The patient expressed understanding and agreed to proceed.   I discussed the assessment and treatment plan with the patient. The patient was provided an opportunity to ask questions and all were answered. The patient agreed with the plan and demonstrated an understanding of the instructions.   The patient was advised to call back or seek an in-person evaluation if the symptoms worsen or if the condition fails to improve as anticipated.  I provided 21  minutes of non-face-to-face time during this encounter.  HISTORY/CURRENT STATUS: for routine med check.  Doing well. In her second year of grad school at [employer] to be an [employer] Likes it a lot.  Good grades. She still has anxiety in general, not having panic attacks like she used to.  She takes Xanax occasionally.  Her last prescription lasted over a year.  She does not feel that it is as effective as it once was though. She's already tried taking 2 (total of 0.5 mg) a few times, and that was more helpful. She never drinks when she takes the Xanax.   Patient is able to enjoy things.  Energy and motivation are good.  No extreme sadness, tearfulness, or  feelings of hopelessness.  Sleeps well most of the time. She takes the Trazodone some nights but doesn't want to take it every night. ADLs and personal hygiene are normal.   Denies any changes in concentration, making decisions, or remembering things.  Appetite has not changed.  Weight is stable.  Denies suicidal or homicidal thoughts.  Patient denies increased energy with decreased need for sleep, increased talkativeness, racing thoughts, impulsivity or risky behaviors, increased spending, increased libido, grandiosity, increased irritability or anger, paranoia, or hallucinations.  Denies dizziness, syncope, seizures, numbness, tingling, tremor, tics, unsteady gait, slurred speech, confusion. Denies muscle or joint pain, stiffness, or dystonia.  Individual Medical History/ Review of Systems: Changes? :No    Past medications for mental health diagnoses include: Zoloft, Trazodone, Buspar probably wasn't effective (she only took for 2 months though)  Allergies: Patient has no known allergies.  Current Medications:  Current Outpatient Medications:    ALPRAZolam (XANAX) 0.5 MG tablet, Take 1 tablet (0.5 mg total) by mouth daily as needed for anxiety., Disp: 20 tablet, Rfl: 0   cetirizine (ZYRTEC) 10 MG tablet, Take 10 mg by mouth daily. (Patient not taking: Reported on 02/25/2022), Disp: , Rfl:    escitalopram (LEXAPRO) 20 MG tablet, Take 1 tablet (20 mg total) by mouth daily., Disp: 90 tablet, Rfl: 1   traZODone (DESYREL) 50 MG tablet, Take 1-2 tablets (50-100 mg total) by mouth at bedtime as needed., Disp: 180 tablet, Rfl: 1 Medication Side Effects: none  Family Medical/ Social History: Changes? no  MENTAL  HEALTH EXAM:  There were no vitals taken for this visit.There is no height or weight on file to calculate BMI.  General Appearance: Casual and Well Groomed  Eye Contact:  Good  Speech:  Clear and Coherent  Volume:  Normal  Mood:  Euthymic  Affect:  Congruent  Thought Process:  Goal  Directed and Descriptions of Associations: Intact  Orientation:  Full (Time, Place, and Person)  Thought Content: Logical   Suicidal Thoughts:  No  Homicidal Thoughts:  No  Memory:  WNL  Judgement:  Good  Insight:  Good  Psychomotor Activity:  Normal  Concentration:  Concentration: Good and Attention Span: Good  Recall:  Good  Fund of Knowledge: Good  Language: Good  Assets:  Communication Skills Desire for Improvement Financial Resources/Insurance Housing Transportation Vocational/Educational  ADL's:  Intact  Cognition: WNL  Prognosis:  Good   DIAGNOSES:    ICD-10-CM   1. Generalized anxiety disorder  F41.1     2. Mild depression  F32.A     3. Insomnia, unspecified type  G47.00      Receiving Psychotherapy: Yes   RECOMMENDATIONS:  PDMP was reviewed.  Xanax filled 12/15/2022 I provided 21 minutes of non-face-to-face time during this encounter, including time spent before and after the visit in records review, medical decision making, counseling pertinent to today's visit, and charting.   Discussed the anxiety, prevention and tx. She has responded well to Lexapro for prevention and rarely needs the Xanax.  The Xanax is not as effective as it once was so I recommend increasing to 0.5 mg when needed.  She can take 2 of the current Xanax 0.25 mg that she has until they are gone and a prescription for 0.5 mg will be sent in.  Take as sparingly as possible.  Increase Xanax to 0.5 mg, 1 p.o. daily as needed anxiety.   Change Lexapro to 20 mg pills, 1 p.o. daily.  Continue trazodone 50 mg, 1/2-2 p.o. nightly as needed sleep. Continue counseling. Return in 6 months.  Melony Overly, PA-C

## 2023-02-20 ENCOUNTER — Other Ambulatory Visit: Payer: Self-pay | Admitting: Physician Assistant

## 2023-11-30 IMAGING — DX DG FOOT COMPLETE 3+V*L*
3 series · 3 of 3 positions shown · non-contrast
Comparison: None

CLINICAL DATA: Pain and swelling fourth toe, struck toe on bed last
night

EXAM:
LEFT FOOT - COMPLETE 3+ VIEW

[foot ap]
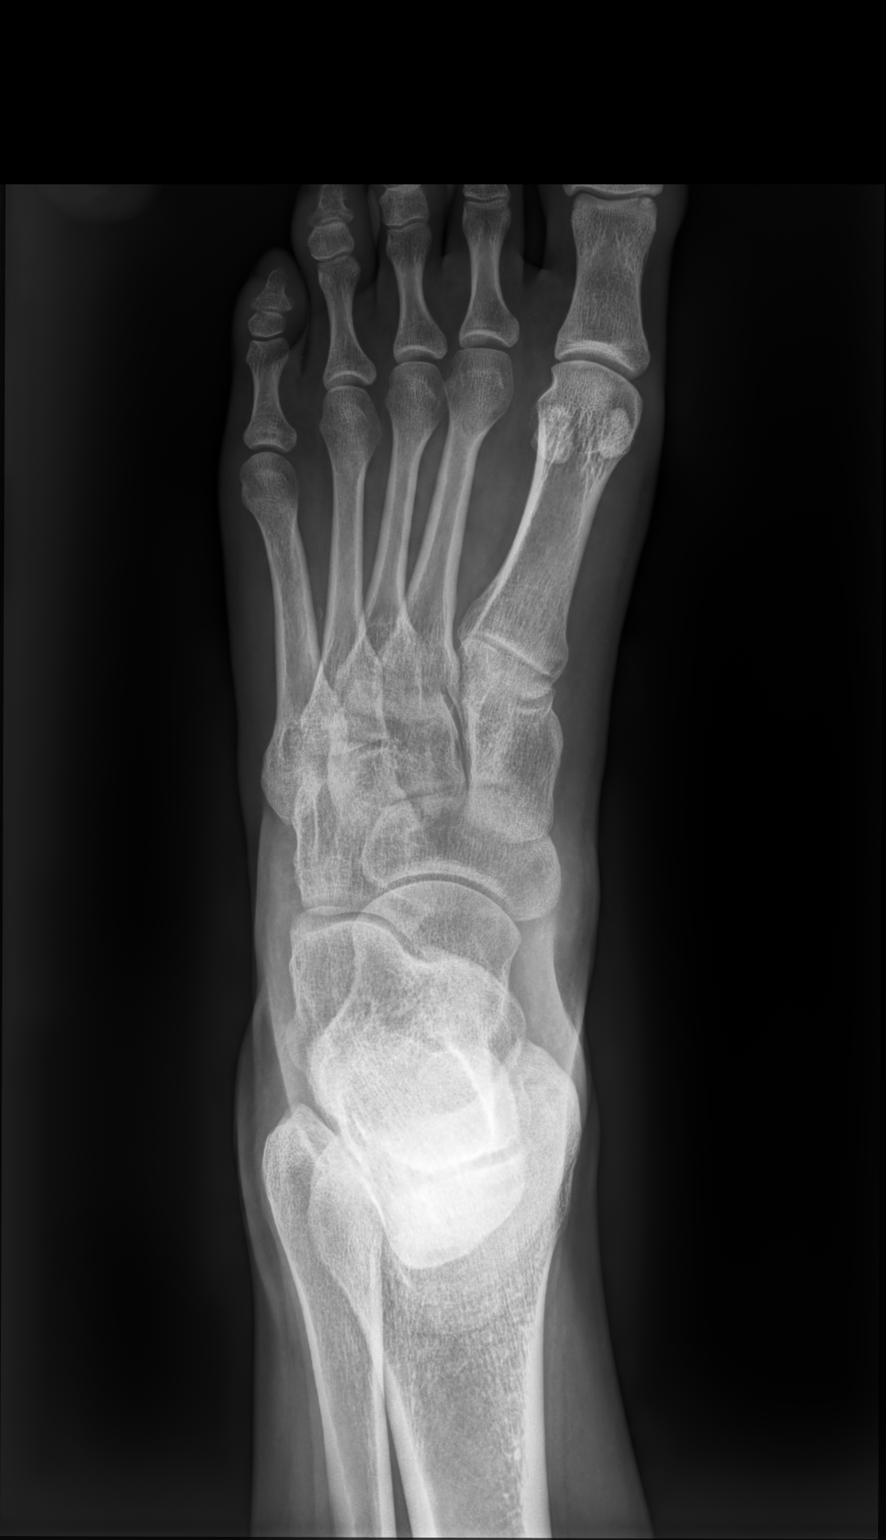

[foot mlo]
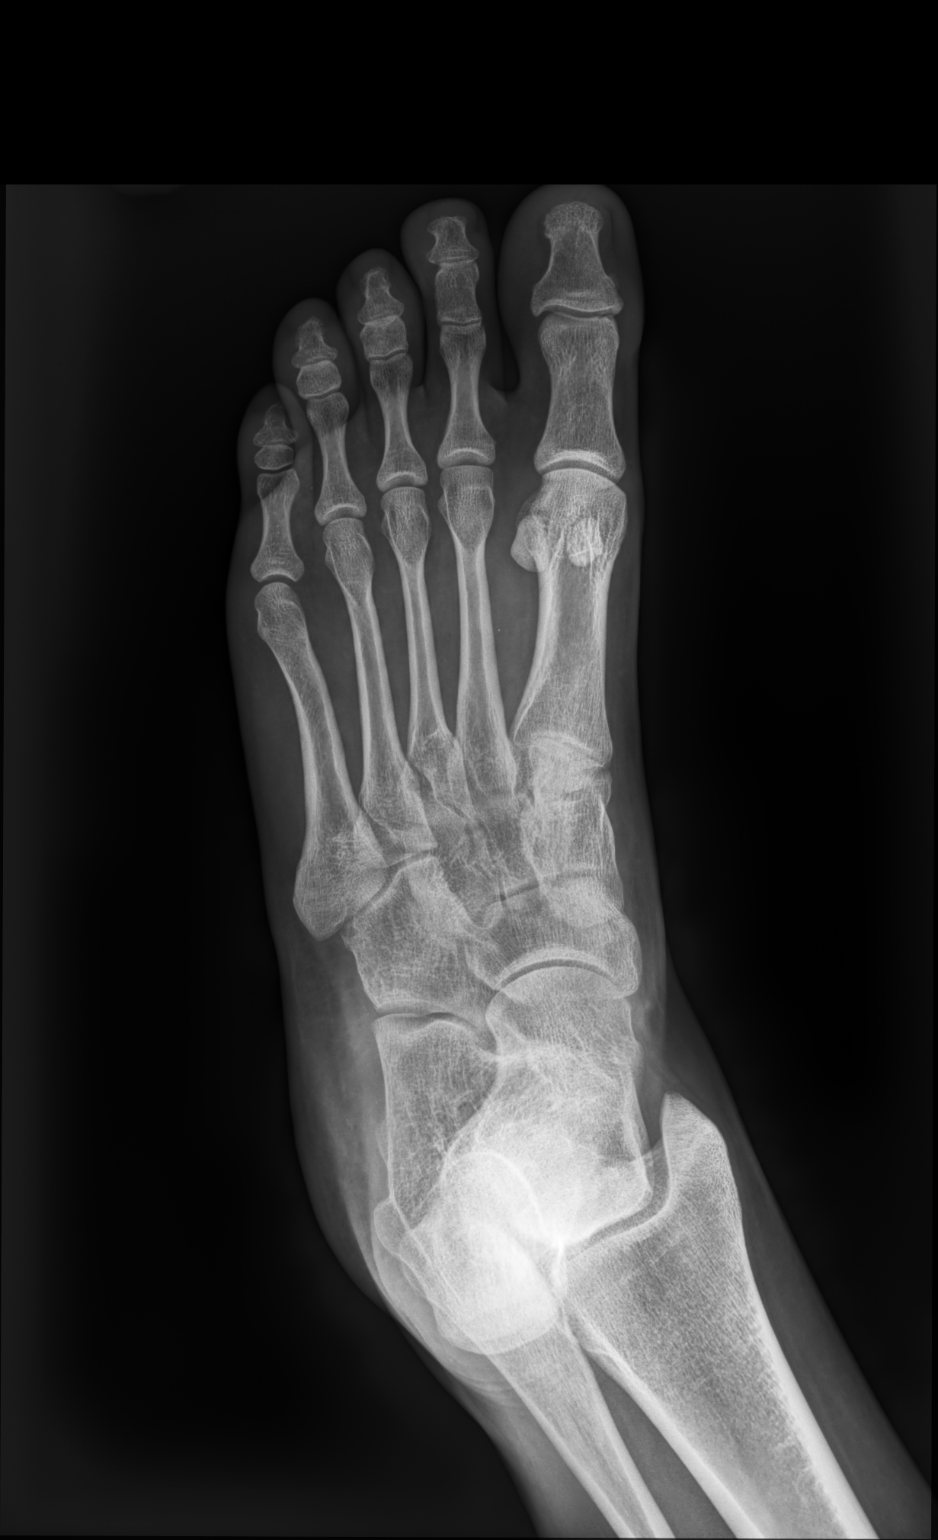

[foot lat]
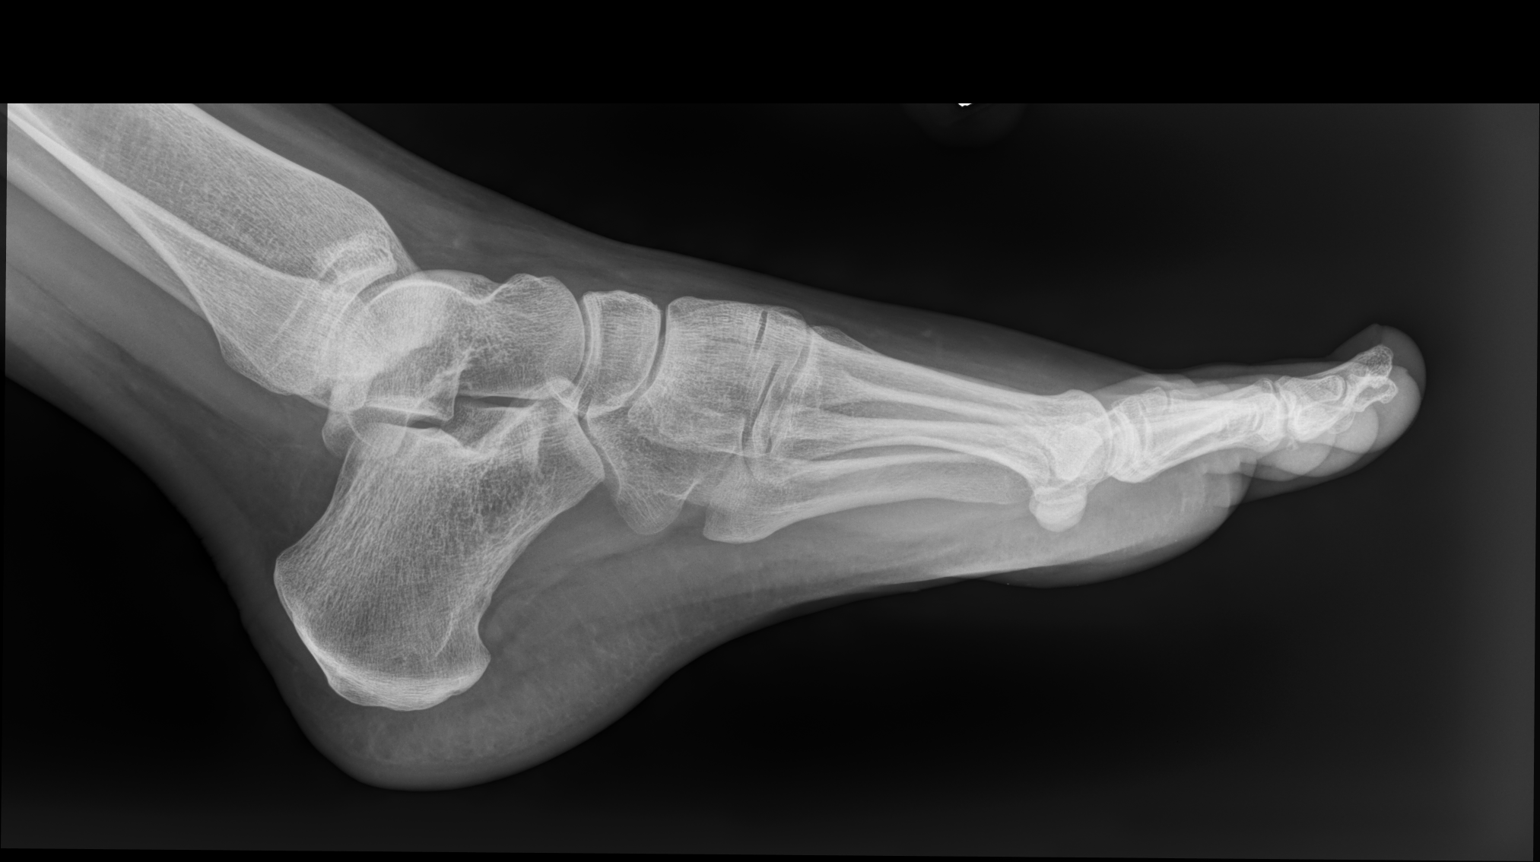

[3 of 3 positions shown; findings below may reference images not displayed]

FINDINGS: Osseous mineralization normal.

Joint spaces preserved.

No acute fracture, dislocation, or bone destruction.
IMPRESSION: No acute osseous abnormalities.
